# Patient Record
Sex: Female | Born: 1955 | Race: White | Hispanic: No | Marital: Single | Smoking: Never smoker
Health system: Southern US, Community
[De-identification: ages and names within clinical notes are randomized; demographics above are authoritative.]

## PROBLEM LIST (undated history)

## (undated) DIAGNOSIS — G4733 Obstructive sleep apnea (adult) (pediatric): Secondary | ICD-10-CM

## (undated) DIAGNOSIS — G5762 Lesion of plantar nerve, left lower limb: Secondary | ICD-10-CM

## (undated) DIAGNOSIS — N950 Postmenopausal bleeding: Secondary | ICD-10-CM

---

## 2006-05-13 ENCOUNTER — Other Ambulatory Visit: Admission: RE | Admit: 2006-05-13 | Discharge: 2006-05-13 | Payer: Self-pay | Admitting: Family Medicine

## 2006-06-03 ENCOUNTER — Encounter: Admission: RE | Admit: 2006-06-03 | Discharge: 2006-06-03 | Payer: Self-pay | Admitting: Family Medicine

## 2007-05-14 ENCOUNTER — Other Ambulatory Visit: Admission: RE | Admit: 2007-05-14 | Discharge: 2007-05-14 | Payer: Self-pay | Admitting: Family Medicine

## 2007-07-21 ENCOUNTER — Encounter: Admission: RE | Admit: 2007-07-21 | Discharge: 2007-07-21 | Payer: Self-pay | Admitting: Family Medicine

## 2008-09-06 ENCOUNTER — Other Ambulatory Visit: Admission: RE | Admit: 2008-09-06 | Discharge: 2008-09-06 | Payer: Self-pay | Admitting: Family Medicine

## 2009-04-20 ENCOUNTER — Encounter: Admission: RE | Admit: 2009-04-20 | Discharge: 2009-04-20 | Payer: Self-pay | Admitting: Family Medicine

## 2010-07-06 ENCOUNTER — Encounter: Admission: RE | Admit: 2010-07-06 | Discharge: 2010-07-06 | Payer: Self-pay | Admitting: Family Medicine

## 2010-07-10 ENCOUNTER — Other Ambulatory Visit: Admission: RE | Admit: 2010-07-10 | Discharge: 2010-07-10 | Payer: Self-pay | Admitting: Family Medicine

## 2011-09-02 ENCOUNTER — Other Ambulatory Visit: Payer: Self-pay | Admitting: Family Medicine

## 2011-09-02 DIAGNOSIS — Z1231 Encounter for screening mammogram for malignant neoplasm of breast: Secondary | ICD-10-CM

## 2011-09-19 ENCOUNTER — Ambulatory Visit
Admission: RE | Admit: 2011-09-19 | Discharge: 2011-09-19 | Disposition: A | Discharge status: Home / Self Care | Payer: BC Managed Care – PPO | Source: Ambulatory Visit | Attending: Family Medicine | Admitting: Family Medicine

## 2011-09-19 DIAGNOSIS — Z1231 Encounter for screening mammogram for malignant neoplasm of breast: Secondary | ICD-10-CM

## 2012-10-14 ENCOUNTER — Other Ambulatory Visit: Payer: Self-pay | Admitting: Family Medicine

## 2012-10-14 DIAGNOSIS — Z1231 Encounter for screening mammogram for malignant neoplasm of breast: Secondary | ICD-10-CM

## 2012-11-20 ENCOUNTER — Ambulatory Visit: Payer: BC Managed Care – PPO

## 2012-12-02 ENCOUNTER — Ambulatory Visit: Payer: BC Managed Care – PPO

## 2013-01-12 ENCOUNTER — Ambulatory Visit
Admission: RE | Admit: 2013-01-12 | Discharge: 2013-01-12 | Disposition: A | Discharge status: Home / Self Care | Payer: BC Managed Care – PPO | Source: Ambulatory Visit | Attending: Family Medicine | Admitting: Family Medicine

## 2013-01-12 DIAGNOSIS — Z1231 Encounter for screening mammogram for malignant neoplasm of breast: Secondary | ICD-10-CM

## 2013-12-14 ENCOUNTER — Other Ambulatory Visit: Payer: Self-pay | Admitting: Family Medicine

## 2013-12-14 ENCOUNTER — Other Ambulatory Visit (HOSPITAL_COMMUNITY)
Admission: RE | Admit: 2013-12-14 | Discharge: 2013-12-14 | Disposition: A | Discharge status: Home / Self Care | Payer: 59 | Source: Ambulatory Visit | Attending: Family Medicine | Admitting: Family Medicine

## 2013-12-14 DIAGNOSIS — Z1151 Encounter for screening for human papillomavirus (HPV): Secondary | ICD-10-CM | POA: Insufficient documentation

## 2013-12-14 DIAGNOSIS — Z124 Encounter for screening for malignant neoplasm of cervix: Secondary | ICD-10-CM | POA: Insufficient documentation

## 2014-02-03 ENCOUNTER — Other Ambulatory Visit: Payer: Self-pay

## 2014-02-03 DIAGNOSIS — Z1231 Encounter for screening mammogram for malignant neoplasm of breast: Secondary | ICD-10-CM

## 2014-02-22 ENCOUNTER — Ambulatory Visit: Payer: 59

## 2014-02-23 ENCOUNTER — Ambulatory Visit
Admission: RE | Admit: 2014-02-23 | Discharge: 2014-02-23 | Disposition: A | Discharge status: Home / Self Care | Payer: 59 | Source: Ambulatory Visit

## 2014-02-23 DIAGNOSIS — Z1231 Encounter for screening mammogram for malignant neoplasm of breast: Secondary | ICD-10-CM

## 2014-06-03 ENCOUNTER — Other Ambulatory Visit: Payer: Self-pay | Admitting: Physician Assistant

## 2014-07-12 ENCOUNTER — Other Ambulatory Visit (HOSPITAL_COMMUNITY)
Admission: RE | Admit: 2014-07-12 | Discharge: 2014-07-12 | Disposition: A | Discharge status: Home / Self Care | Payer: 59 | Source: Ambulatory Visit | Attending: Family Medicine | Admitting: Family Medicine

## 2014-07-12 ENCOUNTER — Other Ambulatory Visit: Payer: Self-pay | Admitting: Family Medicine

## 2014-07-12 DIAGNOSIS — R87610 Atypical squamous cells of undetermined significance on cytologic smear of cervix (ASC-US): Secondary | ICD-10-CM | POA: Diagnosis not present

## 2014-07-13 LAB — CYTOLOGY - PAP

## 2015-01-25 ENCOUNTER — Other Ambulatory Visit: Payer: Self-pay

## 2015-01-25 DIAGNOSIS — Z1231 Encounter for screening mammogram for malignant neoplasm of breast: Secondary | ICD-10-CM

## 2015-02-27 ENCOUNTER — Ambulatory Visit
Admission: RE | Admit: 2015-02-27 | Discharge: 2015-02-27 | Disposition: A | Discharge status: Home / Self Care | Payer: 59 | Source: Ambulatory Visit

## 2015-02-27 DIAGNOSIS — Z1231 Encounter for screening mammogram for malignant neoplasm of breast: Secondary | ICD-10-CM

## 2015-05-09 ENCOUNTER — Ambulatory Visit (HOSPITAL_BASED_OUTPATIENT_CLINIC_OR_DEPARTMENT_OTHER): Payer: 59 | Attending: Otolaryngology | Admitting: Radiology

## 2015-05-09 VITALS — Ht 64.0 in | Wt 158.0 lb

## 2015-05-09 DIAGNOSIS — G4733 Obstructive sleep apnea (adult) (pediatric): Secondary | ICD-10-CM | POA: Diagnosis not present

## 2015-05-09 DIAGNOSIS — R0683 Snoring: Secondary | ICD-10-CM | POA: Insufficient documentation

## 2015-05-09 DIAGNOSIS — G4700 Insomnia, unspecified: Secondary | ICD-10-CM | POA: Diagnosis present

## 2015-05-20 DIAGNOSIS — R0683 Snoring: Secondary | ICD-10-CM | POA: Diagnosis not present

## 2015-05-20 NOTE — Sleep Study (Signed)
   NAME: Teresa GripKarol J Vogler DATE OF BIRTH:  06/06/1956 MEDICAL RECORD NUMBER [id number]  LOCATION: [facility]  PHYSICIAN: YOUNG,CLINTON D  DATE OF STUDY: 05/09/2015  SLEEP STUDY TYPE: Nocturnal Polysomnogram               REFERRING PHYSICIAN: Christia ReadingBates, Dwight, MD  INDICATION FOR STUDY: Insomnia with sleep apnea  EPWORTH SLEEPINESS SCORE:   7/24 HEIGHT: 5\' 4"  (162.6 cm)  WEIGHT: 158 lb (71.668 kg)    Body mass index is 27.11 kg/(m^2).  NECK SIZE: 14 in.  MEDICATIONS: Charted for review  SLEEP ARCHITECTURE: Total sleep time 296 minutes with sleep efficiency 75.3%. Stage I was 5.4%, stage II 60.6%, stage III 1.0%, REM 32.9% of total sleep time. Sleep latency 19.5 minutes, REM latency 46 minutes, awake after sleep onset 66.5 minutes, arousal index 13, bedtime medication: Ambien, progesterone. She was awake spontaneously between 1:15 and 2:30 AM.  RESPIRATORY DATA: Apnea hypopnea index (AHI) 12.2 per hour. 60 total events scored including 32 obstructive apneas, 2 mixed apneas, 26 hypopneas. Most events were while supine and in rem. REM AHI 25.8 per hour. This study was ordered as a diagnostic polysomnogram without CPAP.  OXYGEN DATA: Moderately loud snoring with oxygen desaturation to a nadir of 82% and mean saturation 92.6% on room air  CARDIAC DATA: Normal sinus rhythm  MOVEMENT/PARASOMNIA: 68 total limb jerks were counted, none of which were associated with arousal or awakening. No bathroom trips  IMPRESSION/ RECOMMENDATION:   1) Despite taking Ambien at bedtime, the patient was spontaneously awake between 1:15 and 2:30 AM. 2) Mild obstructive sleep apnea/hypopnea syndrome, AHI 12.2 per hour with mostly supine events. REM AHI 25.8 per hour. Moderately loud snoring with oxygen desaturation to a nadir of 82% and mean saturation 92.6% on room air. Study was ordered as a diagnostic polysomnogram protocol without CPAP.     Waymon BudgeYOUNG,CLINTON D Diplomate, American Board of Sleep  Medicine  ELECTRONICALLY SIGNED ON:  05/20/2015, 9:47 AM [facility] PH: [phone]   FX: [phone] ACCREDITED BY THE AMERICAN ACADEMY OF SLEEP MEDICINE

## 2015-06-07 ENCOUNTER — Encounter (HOSPITAL_BASED_OUTPATIENT_CLINIC_OR_DEPARTMENT_OTHER): Payer: 59

## 2015-08-22 ENCOUNTER — Ambulatory Visit (INDEPENDENT_AMBULATORY_CARE_PROVIDER_SITE_OTHER): Payer: 59 | Admitting: Sports Medicine

## 2015-08-22 ENCOUNTER — Encounter: Payer: Self-pay | Admitting: Sports Medicine

## 2015-08-22 VITALS — BP 128/86 | Ht 64.0 in | Wt 160.0 lb

## 2015-08-22 DIAGNOSIS — M79672 Pain in left foot: Secondary | ICD-10-CM

## 2015-08-22 NOTE — Assessment & Plan Note (Signed)
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  This is related to the loss of her transverse arch  - two small metatarsal pads place on Birkenstock's  - encourage to continue exercising without pain  - would advise against using minimalist shoes  - she can use ibuprofen or aleve for pain PRN  - she will f/u with other shoes for metatarsal pads to be placed  - could try green insoles at f/u and will need custom orthotics at some point.

## 2015-08-22 NOTE — Patient Instructions (Signed)
Please follow up and bring your shoes with you.   We will place some metatarsal pads in them and green insoles.

## 2015-08-22 NOTE — Progress Notes (Signed)
  Teresa Avery - 59 y.o. female MRN [id number]  Date of birth: [birth year]   Teresa Avery is a 59 y.o. female who presents today for left foot pain. She saw an orthopedist about three years ago and diagnosed with Morton's Neuroma.   About three months ago she started wearing a minimalist shoe while working out. She does functional fitness and walks her dog. She initially would work out about 4 days per week and is now doing 3 days per week.  She started having more pain in the forefoot and in her left great toe.  It started hurting bad enough that she couldn't do sprints.  She started wearing Birkenstocks and that has improved her pain.  She will fell a "clicking" sensation in her foot.  She denies any prior injury or surgery to her left foot.     PMHx - Updated and reviewed.   PSHx - Updated and reviewed.  FHx - Updated and reviewed. Medications - hormones    ROS Per HPI   Exam:  Filed Vitals:   08/22/15 1404  BP: 128/86   Gen: NAD Cardiorespiratory - Normal respiratory effort/rate.   Foot Exam:  Laterality: left Appearance: no erythema or ecchymosis,  left calcaneal valgus at baseline   calcaneous comes to midline while upon tip toes.  Splaying of 2nd and 3rd toes on left  Loss of transverse arch on left  Callus formation on plantar aspect of great toe b/l. No other overt callus formation  Gait: pronation with ambulation Edema: none   Tenderness: TTP between 3rd and 4th metatarsal   Neurovascularly intact: yes Arch: partial loss of longitudinal on left and neutral on right  Maneuvers: Squeeze test: positive on left  Strength:  Dorsiflexion: 5/5 Plantarflexion: 5/5 Inversion: 5/5 Eversion: 5/5   Left foot pain Pain most likely 2/2 morton's neuroma.  This is related to the loss of her transverse arch  - two small metatarsal pads place on Birkenstock's  - encourage to continue exercising without pain  - would advise against using minimalist shoes  - she can use  ibuprofen or aleve for pain PRN  - she will f/u with other shoes for metatarsal pads to be placed  - could try green insoles at f/u and will need custom orthotics at some point.

## 2015-09-07 ENCOUNTER — Encounter: Payer: Self-pay | Admitting: Sports Medicine

## 2015-09-07 ENCOUNTER — Ambulatory Visit (INDEPENDENT_AMBULATORY_CARE_PROVIDER_SITE_OTHER): Payer: 59 | Admitting: Sports Medicine

## 2015-09-07 VITALS — BP 146/87 | HR 67 | Ht 64.0 in | Wt 160.0 lb

## 2015-09-07 DIAGNOSIS — G5762 Lesion of plantar nerve, left lower limb: Secondary | ICD-10-CM | POA: Insufficient documentation

## 2015-09-07 DIAGNOSIS — M79672 Pain in left foot: Secondary | ICD-10-CM | POA: Diagnosis not present

## 2015-09-07 NOTE — Assessment & Plan Note (Signed)
Pain much improved with MT pads  Vies visit note

## 2015-09-07 NOTE — Assessment & Plan Note (Signed)
Cont MT support Wide shoes  Sports insoles but later may convert to custom orthotic if needed

## 2015-09-07 NOTE — Progress Notes (Signed)
   [facility] Yolande Jolly, MD Phone: [phone]  Subjective:   # Follow Up for Left Foot pain - pt. With morton's neuroma on the left.  - Foot pain in both feet due to loss of arch.  - She was given metatarsal pads due to loss of transverse arch and morton's neuroma at her last visit.  - She has had a good result with this, and relief of her pain.  - She is here to get her shoes fitted with more metatarsal pads.   All relevant systems were reviewed and were negative unless otherwise noted in the HPI  Past Medical History Reviewed problem list.  Medications- reviewed and updated Current Outpatient Prescriptions  Medication Sig Dispense Refill  . estradiol (CLIMARA - DOSED IN MG/24 HR) 0.025 mg/24hr patch Place 0.025 mg onto the skin once a week.     No current facility-administered medications for this visit.   Chief complaint-noted No additions to family history Social history- patient is a non smoker  Objective: BP 146/87 mmHg  Pulse 67  Ht  (1.626 m)  Wt 160 lb (72.576 kg)  BMI 27.45 kg/m2 INSPECTION: Feet with obvious loss of the transverse and longitudinal arch.  PALPATION: TTP and positive squeeze test on the left with pain between the 2-3 metatarsal.  RANGE OF MOTION: FROM of left and right ankles.  STRENGTH: 5/5 strength in all muscle groups distally.  SPECIAL TESTS: Squeeze test positive.  NEUROVASCULAR STATUS: neurovascularly in tact.   [place] used to confirm presence of neuroma Lt 3/4 which does pop up with Mulder's test   Assessment/Plan:  # Morton's Neuroma / Loss of Transverse Arch.  - Pt. To continue to use the metatarsal pads.  - 5 pairs of shoes padded today.  - Temporary orthotic for her athletic shoes with metatarsal pad was given.  - Instructions to utilize shoes that are comfortable, and with the padding were given.  - No flip flops, or minimalist shoes.  - No exercises that cause pain.  - Return for follow up and  custom orthotic as desired.

## 2015-10-10 ENCOUNTER — Ambulatory Visit (INDEPENDENT_AMBULATORY_CARE_PROVIDER_SITE_OTHER): Payer: 59 | Admitting: Family Medicine

## 2015-10-10 ENCOUNTER — Encounter: Payer: Self-pay | Admitting: Family Medicine

## 2015-10-10 VITALS — BP 128/89 | HR 60 | Ht 64.0 in | Wt 160.0 lb

## 2015-10-10 DIAGNOSIS — G5762 Lesion of plantar nerve, left lower limb: Secondary | ICD-10-CM | POA: Diagnosis not present

## 2015-10-10 DIAGNOSIS — M79672 Pain in left foot: Secondary | ICD-10-CM

## 2015-10-11 NOTE — Assessment & Plan Note (Signed)
Continue wearing wide shoes and metatarsal pads in shoes.  Custom orthotic constructed today. F/u for further orthotic construction.

## 2015-10-11 NOTE — Progress Notes (Signed)
  Teresa GripKarol J Avery - 59 y.o. female MRN [id number]  Date of birth: 11/10/1956  CC: Left Foot Pain  SUBJECTIVE:   HPI Follow of left foot pain for orthotics:  - morton's neuroma on the left - B/l foot pain with loss of transverse and longitudinal arches - Improvement following use of metatarsal pads on temporary insoles.  - Interested in getting long term orthotics.   ROS:     10 point RoS negative other than that listed in HPI.   HISTORY: Past Medical, Surgical, Social, and Family History Reviewed & Updated per EMR.    OBJECTIVE: BP 128/89 mmHg  Pulse 60  Ht 5\' 4"  (1.626 m)  Wt 160 lb (72.576 kg)  BMI 27.45 kg/m2  Physical Exam  Calm, NAD Speaking in full sentences without respiratory distress.   INSPECTION: B/l loss of the transverse and longitudinal arch.  PALPATION: TTP and positive squeeze test on the left with pain between the 2-3 metatarsal on the lef.  STRENGTH: 5/5 strength in all muscle groups distally.  SPECIAL TESTS: Squeeze test positive. + Mulder NEUROVASCULAR STATUS: neurovascularly in tact.   MEDICATIONS, LABS & OTHER ORDERS: Previous Medications   ESTRADIOL (CLIMARA - DOSED IN MG/24 HR) 0.025 MG/24HR PATCH    Place 0.025 mg onto the skin once a week.   Modified Medications   No medications on file   New Prescriptions   No medications on file   Discontinued Medications   No medications on file  No orders of the defined types were placed in this encounter.   ASSESSMENT & PLAN: See problem based charting & AVS for pt instructions.  Patient was fitted for a : standard, cushioned, semi-rigid orthotic. The orthotic was heated and afterward the patient stood on the orthotic blank positioned on the orthotic stand. The patient was positioned in subtalar neutral position and 10 degrees of ankle dorsiflexion in a weight bearing stance. After completion of molding, a stable base was applied to the orthotic blank. The blank was ground to a stable position for  weight bearing. Size:7  Base: BLUE EVA Posting: right medial heel wedge on the left.  Additional orthotic padding:metatarsal pads.   40 minutes were spent in consultation and construction/adjustment of orthotics. More than 50% of which was face to face time with the patient.

## 2016-03-07 ENCOUNTER — Other Ambulatory Visit: Payer: Self-pay

## 2016-03-07 ENCOUNTER — Other Ambulatory Visit: Payer: Self-pay | Admitting: Neurology

## 2016-03-07 DIAGNOSIS — Z1231 Encounter for screening mammogram for malignant neoplasm of breast: Secondary | ICD-10-CM

## 2016-03-12 ENCOUNTER — Ambulatory Visit
Admission: RE | Admit: 2016-03-12 | Discharge: 2016-03-12 | Disposition: A | Discharge status: Home / Self Care | Payer: 59 | Source: Ambulatory Visit

## 2016-03-12 DIAGNOSIS — Z1231 Encounter for screening mammogram for malignant neoplasm of breast: Secondary | ICD-10-CM

## 2016-07-03 ENCOUNTER — Other Ambulatory Visit: Payer: Self-pay | Admitting: Family Medicine

## 2016-07-03 DIAGNOSIS — N950 Postmenopausal bleeding: Secondary | ICD-10-CM

## 2016-07-15 ENCOUNTER — Ambulatory Visit
Admission: RE | Admit: 2016-07-15 | Discharge: 2016-07-15 | Disposition: A | Discharge status: Home / Self Care | Payer: 59 | Source: Ambulatory Visit | Attending: Family Medicine | Admitting: Family Medicine

## 2016-07-15 DIAGNOSIS — N950 Postmenopausal bleeding: Secondary | ICD-10-CM

## 2016-08-05 ENCOUNTER — Other Ambulatory Visit: Payer: Self-pay | Admitting: Neurosurgery

## 2016-08-05 DIAGNOSIS — M5023 Other cervical disc displacement, cervicothoracic region: Secondary | ICD-10-CM

## 2016-09-27 ENCOUNTER — Encounter (HOSPITAL_BASED_OUTPATIENT_CLINIC_OR_DEPARTMENT_OTHER): Payer: Self-pay | Admitting: *Deleted

## 2016-09-30 ENCOUNTER — Encounter (HOSPITAL_BASED_OUTPATIENT_CLINIC_OR_DEPARTMENT_OTHER): Admission: RE | Payer: Self-pay | Source: Ambulatory Visit

## 2016-09-30 ENCOUNTER — Ambulatory Visit (HOSPITAL_BASED_OUTPATIENT_CLINIC_OR_DEPARTMENT_OTHER): Admission: RE | Admit: 2016-09-30 | Payer: 59 | Source: Ambulatory Visit | Admitting: Obstetrics and Gynecology

## 2016-09-30 HISTORY — DX: Lesion of plantar nerve, left lower limb: G57.62

## 2016-09-30 HISTORY — DX: Postmenopausal bleeding: N95.0

## 2016-09-30 HISTORY — DX: Obstructive sleep apnea (adult) (pediatric): G47.33

## 2016-09-30 SURGERY — DILATATION & CURETTAGE/HYSTEROSCOPY WITH MYOSURE
Anesthesia: Choice

## 2016-12-24 DIAGNOSIS — K5730 Diverticulosis of large intestine without perforation or abscess without bleeding: Secondary | ICD-10-CM | POA: Diagnosis not present

## 2016-12-24 DIAGNOSIS — F419 Anxiety disorder, unspecified: Secondary | ICD-10-CM | POA: Diagnosis not present

## 2016-12-24 DIAGNOSIS — Z23 Encounter for immunization: Secondary | ICD-10-CM | POA: Diagnosis not present

## 2016-12-24 DIAGNOSIS — F39 Unspecified mood [affective] disorder: Secondary | ICD-10-CM | POA: Diagnosis not present

## 2017-01-20 ENCOUNTER — Encounter: Payer: Self-pay | Admitting: Sports Medicine

## 2017-01-20 ENCOUNTER — Ambulatory Visit (INDEPENDENT_AMBULATORY_CARE_PROVIDER_SITE_OTHER): Payer: BLUE CROSS/BLUE SHIELD | Admitting: Sports Medicine

## 2017-01-20 VITALS — BP 137/88 | HR 73 | Ht 64.0 in | Wt 168.0 lb

## 2017-01-20 DIAGNOSIS — M7741 Metatarsalgia, right foot: Secondary | ICD-10-CM

## 2017-01-20 DIAGNOSIS — H524 Presbyopia: Secondary | ICD-10-CM | POA: Diagnosis not present

## 2017-01-20 DIAGNOSIS — M7742 Metatarsalgia, left foot: Secondary | ICD-10-CM | POA: Diagnosis not present

## 2017-01-20 DIAGNOSIS — H04123 Dry eye syndrome of bilateral lacrimal glands: Secondary | ICD-10-CM | POA: Diagnosis not present

## 2017-01-20 DIAGNOSIS — H5213 Myopia, bilateral: Secondary | ICD-10-CM | POA: Diagnosis not present

## 2017-01-20 DIAGNOSIS — H52223 Regular astigmatism, bilateral: Secondary | ICD-10-CM | POA: Diagnosis not present

## 2017-01-20 DIAGNOSIS — H2513 Age-related nuclear cataract, bilateral: Secondary | ICD-10-CM | POA: Diagnosis not present

## 2017-01-20 NOTE — Progress Notes (Signed)
   Subjective:    Patient ID: Teresa Avery, female    DOB: 04/26/1956, 61 y.o.   MRN: [id number]  HPI chief complaint: Bilateral foot pain  Very pleasant 61 year old female comes in today for new orthotics. She has a history of metatarsalgia and Morton's neuroma. She had custom orthotics constructed in 2016 which have been helpful. She has continued to change out the metatarsal pads but recently she began to notice pain more diffusely across the entire plantar aspect of her forefoot. Her current orthotics are comfortable but somewhat worn.  Past medical history reviewed Medications reviewed Allergies reviewed    Review of Systems    as above Objective:   Physical Exam  Well-developed, well-nourished. No acute distress. Awake alert and oriented 3. Vital signs reviewed  Examination of both feet show loss of both the transverse and longitudinal arches bilaterally. She is tender to palpation along the metatarsal heads on both feet. 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 Pain with squeeze test. Neurovascularly intact distally. Walking without a significant limp.      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 We will try a wider metatarsal cookie instead of the metatarsal pad. She is instructed to continue to wear shoes that are comfortable for her. She needs to avoid those exercises that cause pain. Follow-up as needed.  Total time spent with the patient was 30 minutes with greater than 50% of the time spent in face-to-face consultation discussing her diagnosis, orthotic construction, orthotic instruction, and fitting. Patient found the orthotics comfortable prior to leaving the office.  Patient was fitted for a : standard, cushioned, semi-rigid orthotic. The orthotic was heated and afterward the patient stood on the orthotic blank positioned on the orthotic stand. The patient was  positioned in subtalar neutral position and 10 degrees of ankle dorsiflexion in a weight bearing stance. After completion of molding, a stable base was applied to the orthotic blank. The blank was ground to a stable position for weight bearing. Size: 7 Base: Blue EVA Posting:none Additional orthotic padding: medial heel wedge

## 2017-03-18 ENCOUNTER — Other Ambulatory Visit: Payer: Self-pay | Admitting: Family Medicine

## 2017-03-18 DIAGNOSIS — Z1231 Encounter for screening mammogram for malignant neoplasm of breast: Secondary | ICD-10-CM

## 2017-03-19 DIAGNOSIS — Z0000 Encounter for general adult medical examination without abnormal findings: Secondary | ICD-10-CM | POA: Diagnosis not present

## 2017-03-19 DIAGNOSIS — E7800 Pure hypercholesterolemia, unspecified: Secondary | ICD-10-CM | POA: Diagnosis not present

## 2017-04-03 ENCOUNTER — Ambulatory Visit
Admission: RE | Admit: 2017-04-03 | Discharge: 2017-04-03 | Disposition: A | Discharge status: Home / Self Care | Payer: BLUE CROSS/BLUE SHIELD | Source: Ambulatory Visit | Attending: Family Medicine | Admitting: Family Medicine

## 2017-04-03 DIAGNOSIS — Z1231 Encounter for screening mammogram for malignant neoplasm of breast: Secondary | ICD-10-CM | POA: Diagnosis not present

## 2017-04-04 DIAGNOSIS — F4323 Adjustment disorder with mixed anxiety and depressed mood: Secondary | ICD-10-CM | POA: Diagnosis not present

## 2017-05-14 DIAGNOSIS — F4323 Adjustment disorder with mixed anxiety and depressed mood: Secondary | ICD-10-CM | POA: Diagnosis not present

## 2017-08-13 DIAGNOSIS — H00013 Hordeolum externum right eye, unspecified eyelid: Secondary | ICD-10-CM | POA: Diagnosis not present

## 2017-09-08 DIAGNOSIS — F4323 Adjustment disorder with mixed anxiety and depressed mood: Secondary | ICD-10-CM | POA: Diagnosis not present

## 2017-09-12 DIAGNOSIS — F4323 Adjustment disorder with mixed anxiety and depressed mood: Secondary | ICD-10-CM | POA: Diagnosis not present

## 2017-09-30 DIAGNOSIS — F4323 Adjustment disorder with mixed anxiety and depressed mood: Secondary | ICD-10-CM | POA: Diagnosis not present

## 2017-10-16 ENCOUNTER — Ambulatory Visit (INDEPENDENT_AMBULATORY_CARE_PROVIDER_SITE_OTHER): Payer: BLUE CROSS/BLUE SHIELD | Admitting: Sports Medicine

## 2017-10-16 VITALS — BP 108/80 | Ht 64.0 in | Wt 152.0 lb

## 2017-10-16 DIAGNOSIS — M7741 Metatarsalgia, right foot: Secondary | ICD-10-CM

## 2017-10-16 DIAGNOSIS — M7742 Metatarsalgia, left foot: Secondary | ICD-10-CM | POA: Diagnosis not present

## 2017-10-17 NOTE — Progress Notes (Signed)
   Subjective:    Patient ID: Teresa Avery, female    DOB: 02/03/1956, 61 y.o.   MRN: [id number]  HPI chief complaint: Bilateral foot pain  Patient comes in today with returning bilateral foot pain. This is a chronic problem for her. We've made her custom orthotics in the past. They've been very comfortable for her. She recently purchased a new pair of shoes and thinks that her current orthotics are too small for them.She has also started increasing her exercise, specifically walking 4 miles a day in addition to doing Zumba twice a week. She has been able to lose 20 pounds with this exercise regimen. Her pain is along the metatarsal heads of both feet. Some arch pain. No injury. No numbness or tingling.  Interim medical history reviewed Medications reviewed Allergies reviewed   Review of Systems As above    Objective:   Physical Exam  Well-developed, well-nourished. No acute distress. Awake alert and oriented 3. Vital signs reviewed  Examination of both feet continues to show loss of the transverse arches bilaterally. Loss of the longitudinal arches well. Slightly tender to palpation along the metatarsal heads on both feet. 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 No soft tissue swelling. Good pulses. Walking without a significant limp.      Assessment & Plan:   Chronic metatarsalgia Pes planus History of Morton's neuroma  Patient's current orthotics are too small for her current tennis shoes. We will make her new custom orthotics today.Her current orthotics also have a metatarsal pad which the patient on herself. However, she has done better with metatarsal cookies in the past. I evaluated her Zumba shoes which have absolutely no arch support and I've recommended that she not use these while exercising. Patient found her new orthotics to be comfortable prior to leaving the office. Total of 30 minutes was spent with the patient with greater than 50% of the time spent in face-to-face  consultation discussing orthotic construction, instruction, and fitting. Patient will follow-up with me as needed.  Patient was fitted for a : standard, cushioned, semi-rigid orthotic. The orthotic was heated and afterward the patient stood on the orthotic blank positioned on the orthotic stand. The patient was positioned in subtalar neutral position and 10 degrees of ankle dorsiflexion in a weight bearing stance. After completion of molding, a stable base was applied to the orthotic blank. The blank was ground to a stable position for weight bearing. Size: 8 Base: Blue EVA Posting: none Additional orthotic padding: B/L metatarsal cookies; medial heel wedge

## 2017-10-20 DIAGNOSIS — F4323 Adjustment disorder with mixed anxiety and depressed mood: Secondary | ICD-10-CM | POA: Diagnosis not present

## 2017-11-07 DIAGNOSIS — G4733 Obstructive sleep apnea (adult) (pediatric): Secondary | ICD-10-CM | POA: Diagnosis not present

## 2017-11-20 DIAGNOSIS — F4323 Adjustment disorder with mixed anxiety and depressed mood: Secondary | ICD-10-CM | POA: Diagnosis not present

## 2017-11-24 DIAGNOSIS — L814 Other melanin hyperpigmentation: Secondary | ICD-10-CM | POA: Diagnosis not present

## 2017-11-24 DIAGNOSIS — L820 Inflamed seborrheic keratosis: Secondary | ICD-10-CM | POA: Diagnosis not present

## 2017-11-24 DIAGNOSIS — D225 Melanocytic nevi of trunk: Secondary | ICD-10-CM | POA: Diagnosis not present

## 2017-11-24 DIAGNOSIS — L821 Other seborrheic keratosis: Secondary | ICD-10-CM | POA: Diagnosis not present

## 2017-11-26 DIAGNOSIS — F4323 Adjustment disorder with mixed anxiety and depressed mood: Secondary | ICD-10-CM | POA: Diagnosis not present

## 2017-12-12 DIAGNOSIS — F4323 Adjustment disorder with mixed anxiety and depressed mood: Secondary | ICD-10-CM | POA: Diagnosis not present

## 2017-12-29 DIAGNOSIS — J0100 Acute maxillary sinusitis, unspecified: Secondary | ICD-10-CM | POA: Diagnosis not present

## 2018-01-05 DIAGNOSIS — F4323 Adjustment disorder with mixed anxiety and depressed mood: Secondary | ICD-10-CM | POA: Diagnosis not present

## 2018-01-20 DIAGNOSIS — F4323 Adjustment disorder with mixed anxiety and depressed mood: Secondary | ICD-10-CM | POA: Diagnosis not present

## 2018-02-02 DIAGNOSIS — F4323 Adjustment disorder with mixed anxiety and depressed mood: Secondary | ICD-10-CM | POA: Diagnosis not present

## 2018-02-17 DIAGNOSIS — F4323 Adjustment disorder with mixed anxiety and depressed mood: Secondary | ICD-10-CM | POA: Diagnosis not present

## 2018-03-04 DIAGNOSIS — F4323 Adjustment disorder with mixed anxiety and depressed mood: Secondary | ICD-10-CM | POA: Diagnosis not present

## 2018-03-23 DIAGNOSIS — G2581 Restless legs syndrome: Secondary | ICD-10-CM | POA: Diagnosis not present

## 2018-03-31 DIAGNOSIS — F4323 Adjustment disorder with mixed anxiety and depressed mood: Secondary | ICD-10-CM | POA: Diagnosis not present

## 2018-04-03 ENCOUNTER — Other Ambulatory Visit: Payer: Self-pay | Admitting: Family Medicine

## 2018-04-03 DIAGNOSIS — Z139 Encounter for screening, unspecified: Secondary | ICD-10-CM

## 2018-04-17 DIAGNOSIS — F4323 Adjustment disorder with mixed anxiety and depressed mood: Secondary | ICD-10-CM | POA: Diagnosis not present

## 2018-05-07 ENCOUNTER — Ambulatory Visit: Payer: BLUE CROSS/BLUE SHIELD

## 2018-05-08 ENCOUNTER — Ambulatory Visit: Payer: BLUE CROSS/BLUE SHIELD

## 2018-05-12 DIAGNOSIS — F4323 Adjustment disorder with mixed anxiety and depressed mood: Secondary | ICD-10-CM | POA: Diagnosis not present

## 2018-06-01 ENCOUNTER — Other Ambulatory Visit: Payer: Self-pay | Admitting: Family Medicine

## 2018-06-01 ENCOUNTER — Other Ambulatory Visit (HOSPITAL_COMMUNITY)
Admission: RE | Admit: 2018-06-01 | Discharge: 2018-06-01 | Disposition: A | Discharge status: Home / Self Care | Payer: BLUE CROSS/BLUE SHIELD | Source: Ambulatory Visit | Attending: Family Medicine | Admitting: Family Medicine

## 2018-06-01 DIAGNOSIS — Z131 Encounter for screening for diabetes mellitus: Secondary | ICD-10-CM | POA: Diagnosis not present

## 2018-06-01 DIAGNOSIS — F4323 Adjustment disorder with mixed anxiety and depressed mood: Secondary | ICD-10-CM | POA: Diagnosis not present

## 2018-06-01 DIAGNOSIS — Z23 Encounter for immunization: Secondary | ICD-10-CM | POA: Diagnosis not present

## 2018-06-01 DIAGNOSIS — Z0000 Encounter for general adult medical examination without abnormal findings: Secondary | ICD-10-CM | POA: Diagnosis not present

## 2018-06-01 DIAGNOSIS — Z1159 Encounter for screening for other viral diseases: Secondary | ICD-10-CM | POA: Diagnosis not present

## 2018-06-01 DIAGNOSIS — Z124 Encounter for screening for malignant neoplasm of cervix: Secondary | ICD-10-CM | POA: Insufficient documentation

## 2018-06-01 DIAGNOSIS — E7800 Pure hypercholesterolemia, unspecified: Secondary | ICD-10-CM | POA: Diagnosis not present

## 2018-06-04 LAB — CYTOLOGY - PAP: Diagnosis: NEGATIVE

## 2018-06-24 DIAGNOSIS — F4323 Adjustment disorder with mixed anxiety and depressed mood: Secondary | ICD-10-CM | POA: Diagnosis not present

## 2018-07-08 DIAGNOSIS — F4323 Adjustment disorder with mixed anxiety and depressed mood: Secondary | ICD-10-CM | POA: Diagnosis not present

## 2018-07-15 DIAGNOSIS — F4323 Adjustment disorder with mixed anxiety and depressed mood: Secondary | ICD-10-CM | POA: Diagnosis not present

## 2018-07-29 DIAGNOSIS — F4323 Adjustment disorder with mixed anxiety and depressed mood: Secondary | ICD-10-CM | POA: Diagnosis not present

## 2018-08-19 DIAGNOSIS — F4323 Adjustment disorder with mixed anxiety and depressed mood: Secondary | ICD-10-CM | POA: Diagnosis not present

## 2018-09-11 DIAGNOSIS — F4323 Adjustment disorder with mixed anxiety and depressed mood: Secondary | ICD-10-CM | POA: Diagnosis not present

## 2018-10-02 DIAGNOSIS — F4323 Adjustment disorder with mixed anxiety and depressed mood: Secondary | ICD-10-CM | POA: Diagnosis not present

## 2018-10-08 DIAGNOSIS — G4733 Obstructive sleep apnea (adult) (pediatric): Secondary | ICD-10-CM | POA: Diagnosis not present

## 2018-10-21 DIAGNOSIS — F4323 Adjustment disorder with mixed anxiety and depressed mood: Secondary | ICD-10-CM | POA: Diagnosis not present

## 2018-11-09 ENCOUNTER — Ambulatory Visit
Admission: RE | Admit: 2018-11-09 | Discharge: 2018-11-09 | Disposition: A | Discharge status: Home / Self Care | Payer: BLUE CROSS/BLUE SHIELD | Source: Ambulatory Visit | Attending: Family Medicine | Admitting: Family Medicine

## 2018-11-09 DIAGNOSIS — F4323 Adjustment disorder with mixed anxiety and depressed mood: Secondary | ICD-10-CM | POA: Diagnosis not present

## 2018-11-09 DIAGNOSIS — Z1231 Encounter for screening mammogram for malignant neoplasm of breast: Secondary | ICD-10-CM | POA: Diagnosis not present

## 2018-11-09 DIAGNOSIS — Z139 Encounter for screening, unspecified: Secondary | ICD-10-CM

## 2018-11-26 DIAGNOSIS — S0501XA Injury of conjunctiva and corneal abrasion without foreign body, right eye, initial encounter: Secondary | ICD-10-CM | POA: Diagnosis not present

## 2018-11-30 DIAGNOSIS — F4323 Adjustment disorder with mixed anxiety and depressed mood: Secondary | ICD-10-CM | POA: Diagnosis not present

## 2018-12-18 DIAGNOSIS — F4323 Adjustment disorder with mixed anxiety and depressed mood: Secondary | ICD-10-CM | POA: Diagnosis not present

## 2019-01-15 DIAGNOSIS — F4323 Adjustment disorder with mixed anxiety and depressed mood: Secondary | ICD-10-CM | POA: Diagnosis not present

## 2019-03-25 DIAGNOSIS — G4733 Obstructive sleep apnea (adult) (pediatric): Secondary | ICD-10-CM | POA: Diagnosis not present

## 2019-07-02 DIAGNOSIS — Z20828 Contact with and (suspected) exposure to other viral communicable diseases: Secondary | ICD-10-CM | POA: Diagnosis not present

## 2019-07-06 DIAGNOSIS — Z03818 Encounter for observation for suspected exposure to other biological agents ruled out: Secondary | ICD-10-CM | POA: Diagnosis not present

## 2019-07-15 DIAGNOSIS — Z0000 Encounter for general adult medical examination without abnormal findings: Secondary | ICD-10-CM | POA: Diagnosis not present

## 2019-07-17 DIAGNOSIS — R110 Nausea: Secondary | ICD-10-CM | POA: Diagnosis not present

## 2019-07-17 DIAGNOSIS — R51 Headache: Secondary | ICD-10-CM | POA: Diagnosis not present

## 2019-07-19 DIAGNOSIS — R110 Nausea: Secondary | ICD-10-CM | POA: Diagnosis not present

## 2019-07-19 DIAGNOSIS — R51 Headache: Secondary | ICD-10-CM | POA: Diagnosis not present

## 2019-07-21 ENCOUNTER — Other Ambulatory Visit: Payer: Self-pay | Admitting: Gastroenterology

## 2019-07-21 DIAGNOSIS — K5792 Diverticulitis of intestine, part unspecified, without perforation or abscess without bleeding: Secondary | ICD-10-CM

## 2019-07-21 DIAGNOSIS — K5900 Constipation, unspecified: Secondary | ICD-10-CM | POA: Diagnosis not present

## 2019-07-21 DIAGNOSIS — R1032 Left lower quadrant pain: Secondary | ICD-10-CM | POA: Diagnosis not present

## 2019-07-21 DIAGNOSIS — E7800 Pure hypercholesterolemia, unspecified: Secondary | ICD-10-CM | POA: Diagnosis not present

## 2019-07-21 DIAGNOSIS — Z79899 Other long term (current) drug therapy: Secondary | ICD-10-CM | POA: Diagnosis not present

## 2019-07-22 ENCOUNTER — Ambulatory Visit
Admission: RE | Admit: 2019-07-22 | Discharge: 2019-07-22 | Disposition: A | Discharge status: Home / Self Care | Payer: BC Managed Care – PPO | Source: Ambulatory Visit | Attending: Gastroenterology | Admitting: Gastroenterology

## 2019-07-22 DIAGNOSIS — K5730 Diverticulosis of large intestine without perforation or abscess without bleeding: Secondary | ICD-10-CM | POA: Diagnosis not present

## 2019-07-22 DIAGNOSIS — K5792 Diverticulitis of intestine, part unspecified, without perforation or abscess without bleeding: Secondary | ICD-10-CM

## 2019-07-22 MED ORDER — IOPAMIDOL (ISOVUE-300) INJECTION 61%
100.0000 mL | Freq: Once | INTRAVENOUS | Status: AC | PRN
  Administered 2019-07-22: 100 mL via INTRAVENOUS

## 2019-08-05 IMAGING — CT CT ABDOMEN AND PELVIS WITH CONTRAST
1 of 3 series · 13 of 32 positions shown, 18 images · IV contrast (APPLIED)
Comparison: No priors.

CLINICAL DATA: 63-year-old female with history of left lower
quadrant abdominal pain. Evaluate for diverticulitis.

EXAM:
CT ABDOMEN AND PELVIS WITH CONTRAST
TECHNIQUE: Multidetector CT imaging of the abdomen and pelvis was performed
using the standard protocol following bolus administration of
intravenous contrast.
CONTRAST:  100mL 4SNU4B-P00 IOPAMIDOL (4SNU4B-P00) INJECTION 61%

[Series 2: abd/pelvis w/cm · axial · 0.71mm/px · z∈[-574,-194]mm · 13 of 88 slices shown, 18 images]
[im 6/88  soft-tissue]
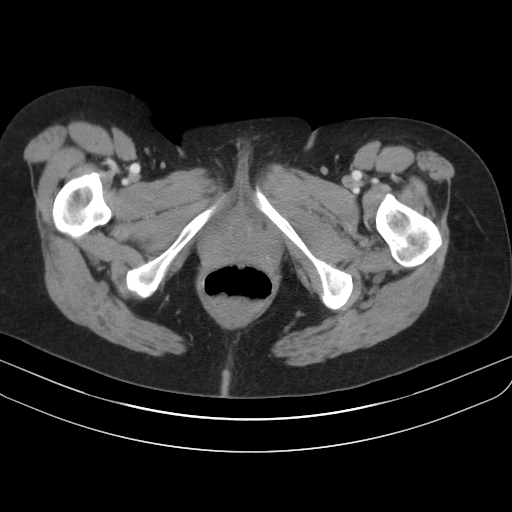
[im 6/88  bone]
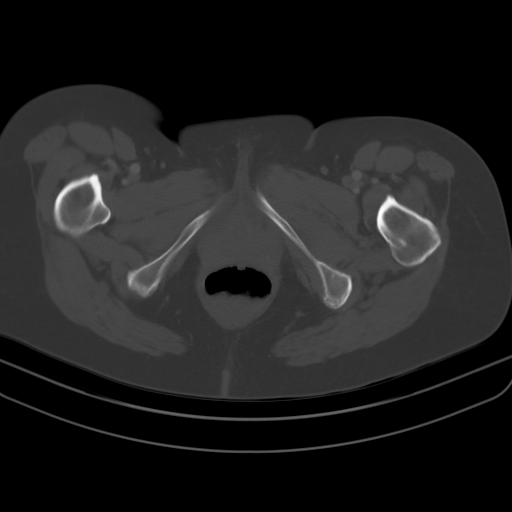
[im 12/88  soft-tissue]
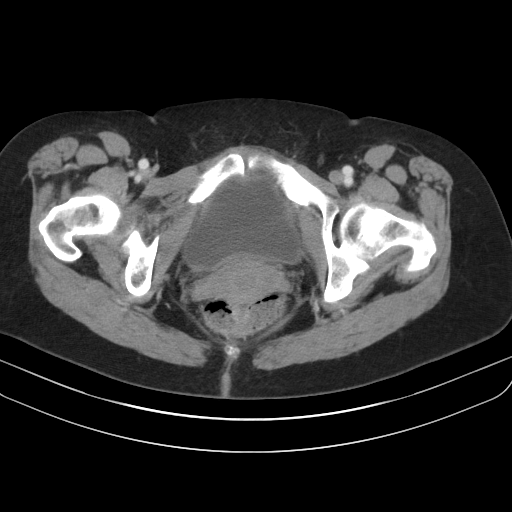
[im 18/88  soft-tissue]
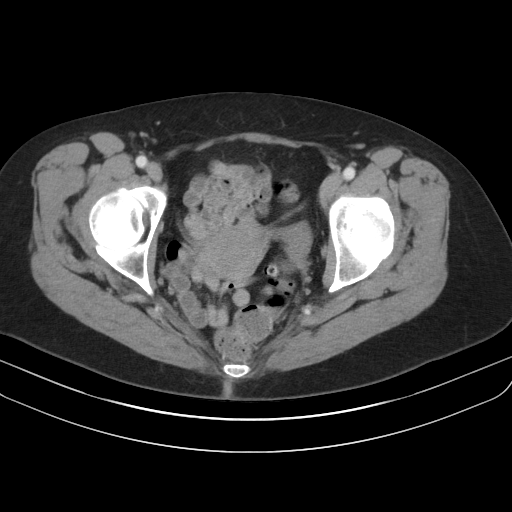
[im 30/88  soft-tissue]
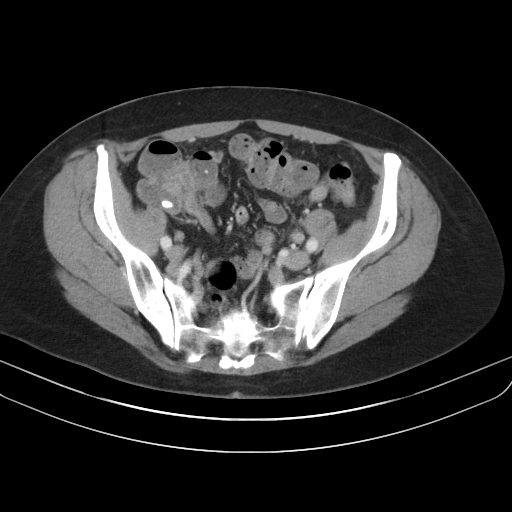
[im 35/88  soft-tissue]
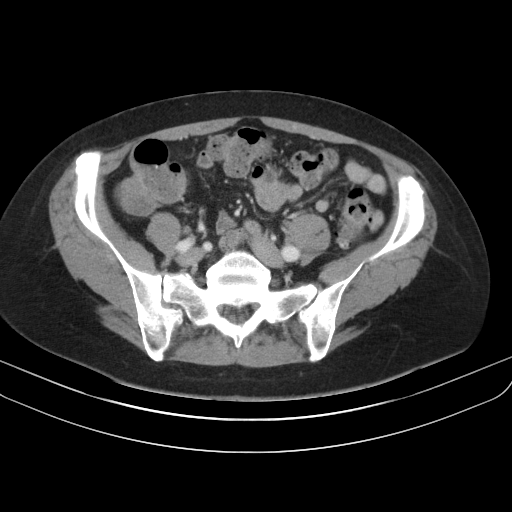
[im 41/88  soft-tissue]
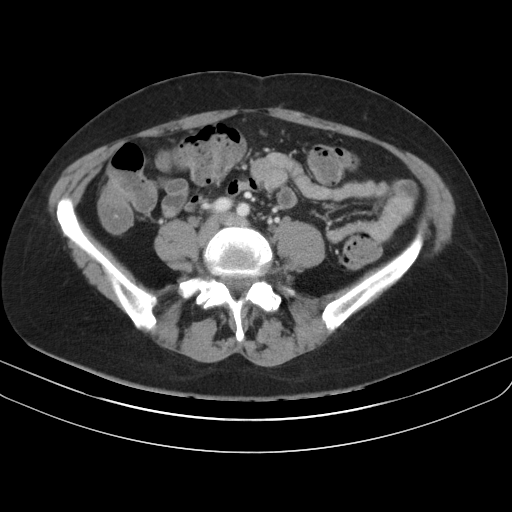
[im 47/88  soft-tissue]
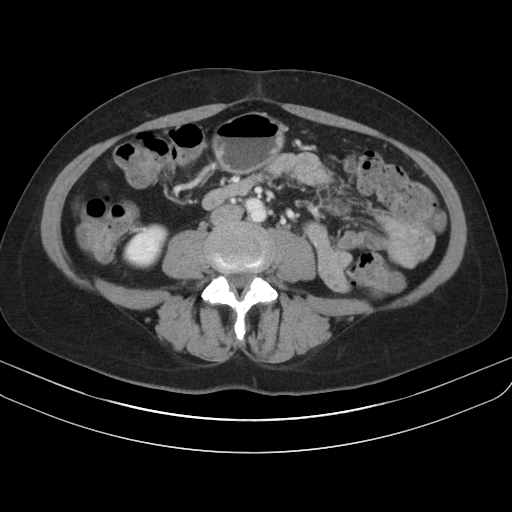
[im 53/88  soft-tissue]
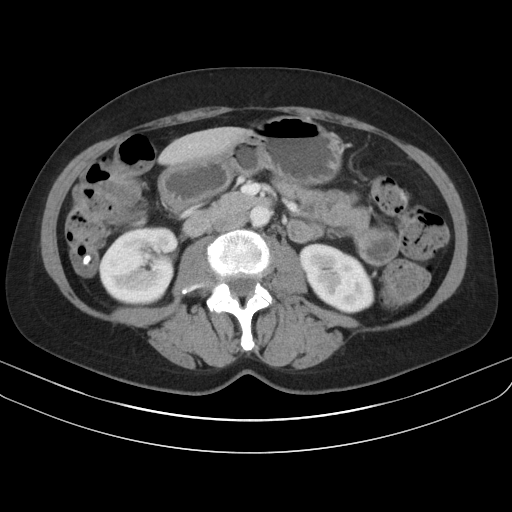
[im 59/88  soft-tissue]
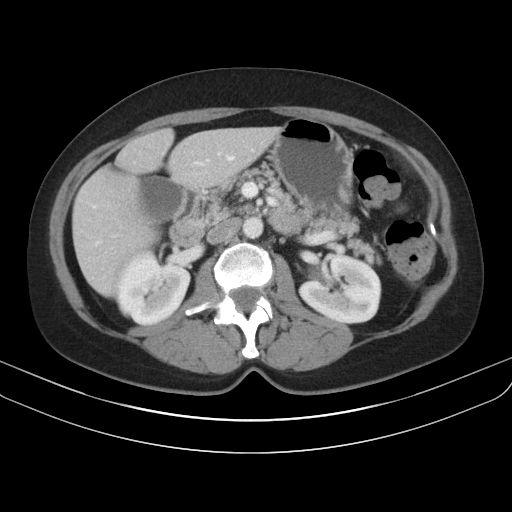
[im 59/88  bone]
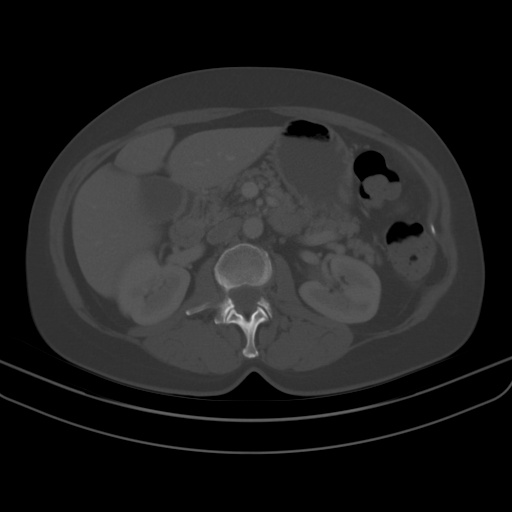
[im 64/88  lung]
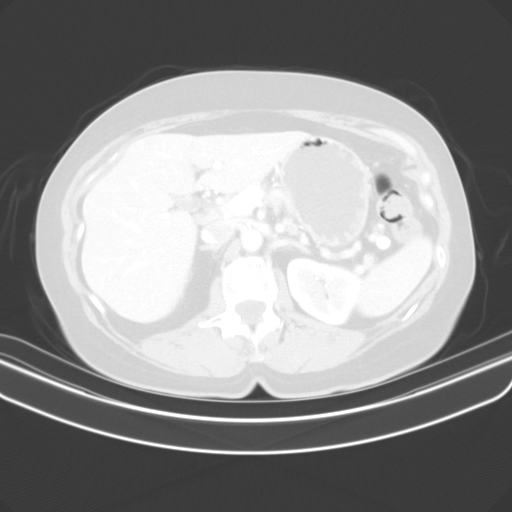
[im 70/88  soft-tissue]
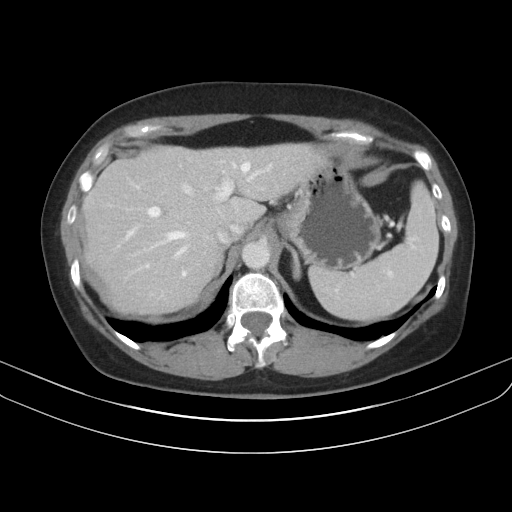
[im 70/88  lung]
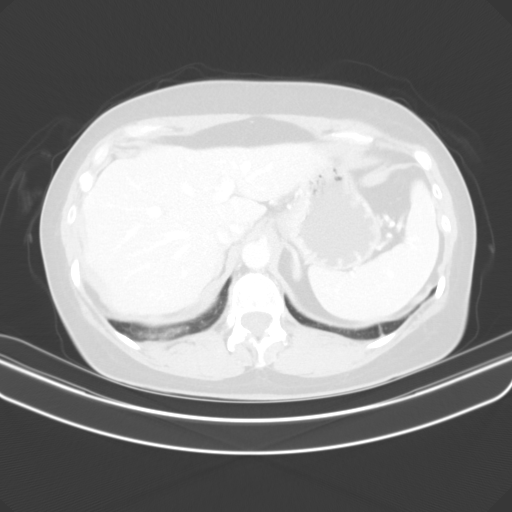
[im 76/88  soft-tissue]
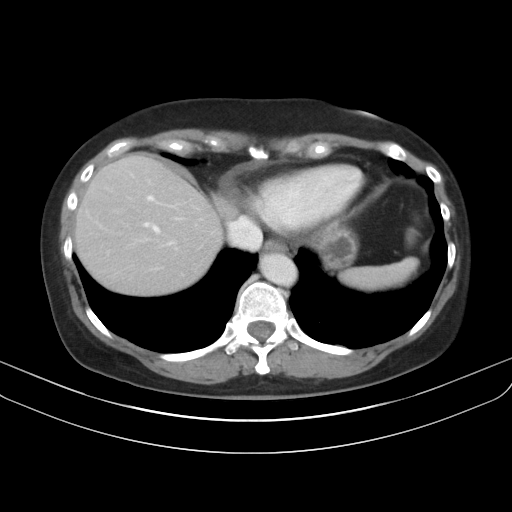
[im 76/88  lung]
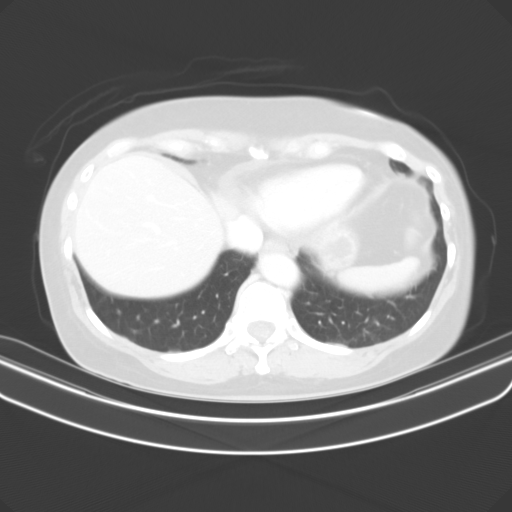
[im 82/88  soft-tissue]
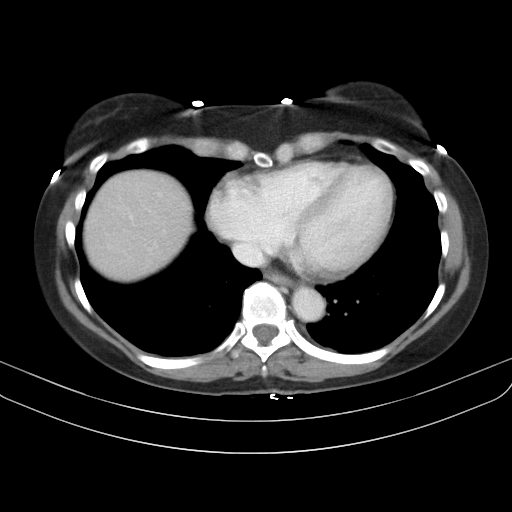
[im 82/88  lung]
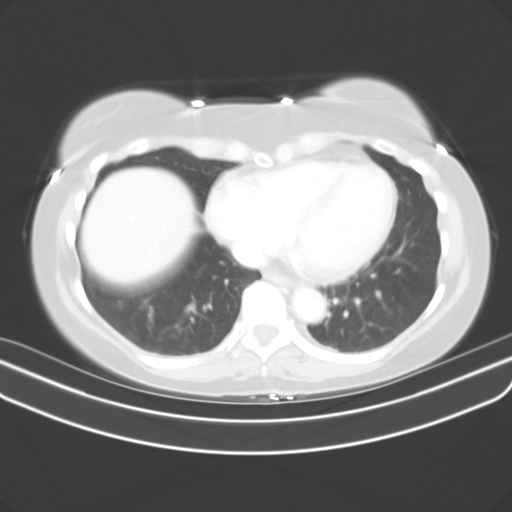

[13 of 32 positions shown; findings below may reference images not displayed]

FINDINGS: Lower chest: Unremarkable.

Hepatobiliary: Subcentimeter low-attenuation lesions in the left
lobe of the liver, too small to characterize, but statistically
likely to represent tiny cysts. No larger more suspicious appearing
hepatic lesions. No intra or extrahepatic biliary ductal dilatation.
Gallbladder is normal in appearance.

Pancreas: No pancreatic mass. No pancreatic ductal dilatation. No
pancreatic or peripancreatic fluid collections or inflammatory
changes.

Spleen: Unremarkable.

Adrenals/Urinary Tract: Subcentimeter low-attenuation lesion in the
upper pole the left kidney, too small to characterize, but
statistically likely to represent a cyst. Right kidney and bilateral
adrenal glands are normal in appearance. No hydroureteronephrosis.
Urinary bladder is normal in appearance.

Stomach/Bowel: Normal appearance of the stomach. No pathologic
dilatation of small bowel or colon. Numerous colonic diverticulae
are noted, without surrounding inflammatory changes to suggest an
acute diverticulitis at this time. Normal appendix.

Vascular/Lymphatic: Aortic atherosclerosis, without evidence of
aneurysm or dissection in the abdominal or pelvic vasculature. No
lymphadenopathy noted in the abdomen or pelvis.

Reproductive: Uterus and ovaries are unremarkable in appearance.

Other: No significant volume of ascites.  No pneumoperitoneum.

Musculoskeletal: There are no aggressive appearing lytic or blastic
lesions noted in the visualized portions of the skeleton.
IMPRESSION: 1. Colonic diverticulosis without findings to suggest an acute
diverticulitis at this time.
2. Normal appendix.
3. Aortic atherosclerosis.
4. Additional incidental findings, as above.

## 2019-09-14 DIAGNOSIS — G4733 Obstructive sleep apnea (adult) (pediatric): Secondary | ICD-10-CM | POA: Diagnosis not present

## 2019-11-04 ENCOUNTER — Other Ambulatory Visit: Payer: Self-pay | Admitting: Family Medicine

## 2019-11-04 DIAGNOSIS — Z1231 Encounter for screening mammogram for malignant neoplasm of breast: Secondary | ICD-10-CM

## 2019-12-10 DIAGNOSIS — G453 Amaurosis fugax: Secondary | ICD-10-CM | POA: Diagnosis not present

## 2019-12-10 DIAGNOSIS — H40013 Open angle with borderline findings, low risk, bilateral: Secondary | ICD-10-CM | POA: Diagnosis not present

## 2019-12-10 DIAGNOSIS — H2513 Age-related nuclear cataract, bilateral: Secondary | ICD-10-CM | POA: Diagnosis not present

## 2019-12-10 DIAGNOSIS — H04123 Dry eye syndrome of bilateral lacrimal glands: Secondary | ICD-10-CM | POA: Diagnosis not present

## 2019-12-13 ENCOUNTER — Other Ambulatory Visit: Payer: Self-pay | Admitting: Family Medicine

## 2019-12-13 DIAGNOSIS — G453 Amaurosis fugax: Secondary | ICD-10-CM

## 2019-12-15 ENCOUNTER — Other Ambulatory Visit (HOSPITAL_COMMUNITY): Payer: Self-pay | Admitting: Family Medicine

## 2019-12-15 DIAGNOSIS — G453 Amaurosis fugax: Secondary | ICD-10-CM

## 2019-12-16 ENCOUNTER — Encounter (HOSPITAL_COMMUNITY): Payer: Self-pay | Admitting: Radiology

## 2019-12-20 ENCOUNTER — Other Ambulatory Visit: Payer: BC Managed Care – PPO

## 2019-12-27 ENCOUNTER — Ambulatory Visit: Payer: BC Managed Care – PPO

## 2019-12-28 ENCOUNTER — Other Ambulatory Visit: Payer: BC Managed Care – PPO

## 2019-12-29 ENCOUNTER — Other Ambulatory Visit (HOSPITAL_COMMUNITY): Payer: BC Managed Care – PPO

## 2020-01-11 ENCOUNTER — Other Ambulatory Visit: Payer: BC Managed Care – PPO

## 2020-02-04 ENCOUNTER — Other Ambulatory Visit: Payer: Self-pay

## 2020-02-04 ENCOUNTER — Ambulatory Visit
Admission: RE | Admit: 2020-02-04 | Discharge: 2020-02-04 | Disposition: A | Discharge status: Home / Self Care | Payer: 59 | Source: Ambulatory Visit | Attending: Family Medicine | Admitting: Family Medicine

## 2020-02-04 DIAGNOSIS — Z1231 Encounter for screening mammogram for malignant neoplasm of breast: Secondary | ICD-10-CM

## 2020-02-08 ENCOUNTER — Other Ambulatory Visit: Payer: Self-pay

## 2020-02-08 ENCOUNTER — Ambulatory Visit (HOSPITAL_COMMUNITY): Payer: 59 | Attending: Cardiovascular Disease

## 2020-02-08 DIAGNOSIS — G453 Amaurosis fugax: Secondary | ICD-10-CM | POA: Diagnosis present

## 2020-03-23 ENCOUNTER — Encounter: Payer: Self-pay | Admitting: Neurology

## 2020-03-27 ENCOUNTER — Ambulatory Visit
Admission: RE | Admit: 2020-03-27 | Discharge: 2020-03-27 | Disposition: A | Discharge status: Home / Self Care | Payer: 59 | Source: Ambulatory Visit | Attending: Family Medicine | Admitting: Family Medicine

## 2020-03-27 DIAGNOSIS — G453 Amaurosis fugax: Secondary | ICD-10-CM

## 2020-04-10 IMAGING — US US CAROTID DUPLEX BILAT
1 series · 13 of 24 positions shown · non-contrast
Comparison: None.

CLINICAL DATA: 64-year-old female with a history of amaurosis fugax
left eye

EXAM:
BILATERAL CAROTID DUPLEX ULTRASOUND
TECHNIQUE: Gray scale imaging, color Doppler and duplex ultrasound were
performed of bilateral carotid and vertebral arteries in the neck.

[Series 1: us carotid duplex bilat · 0.05mm/px · 13 of 63 slices shown]
[im 1/63]
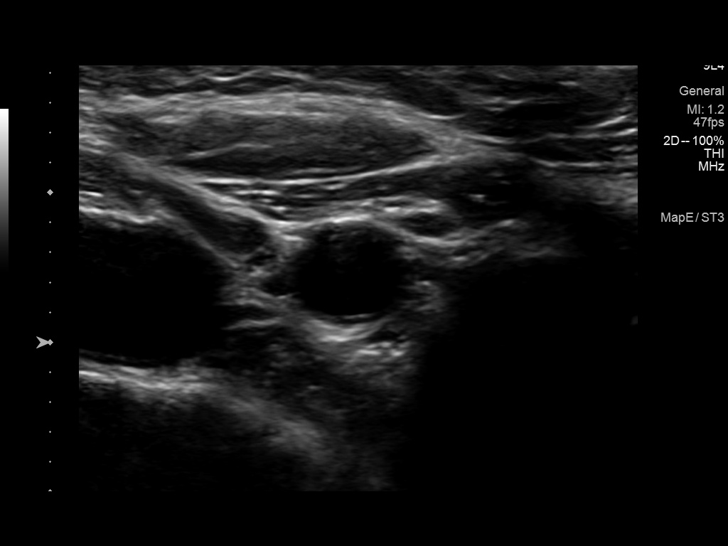
[im 6/63]
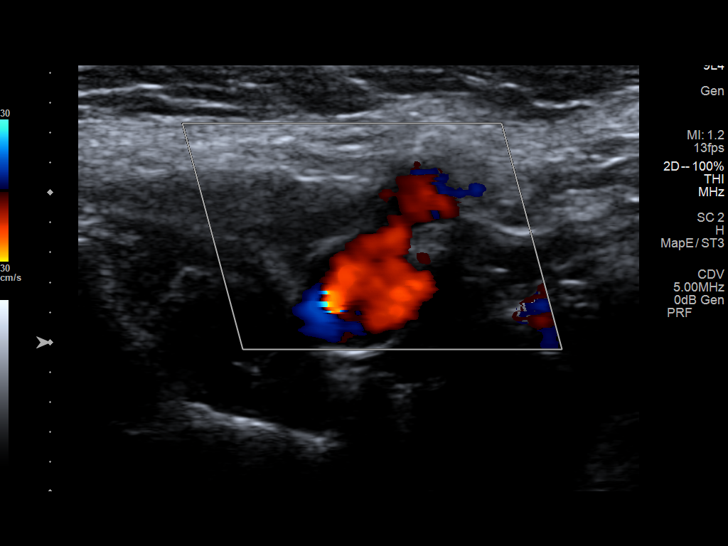
[im 11/63]
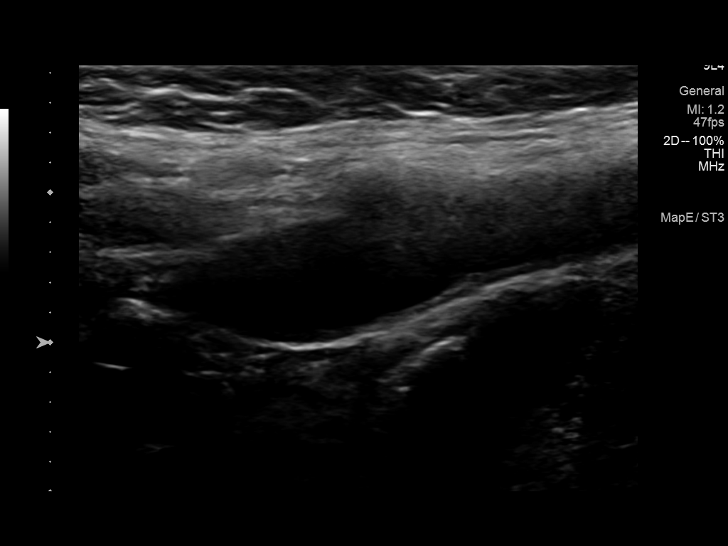
[im 17/63]
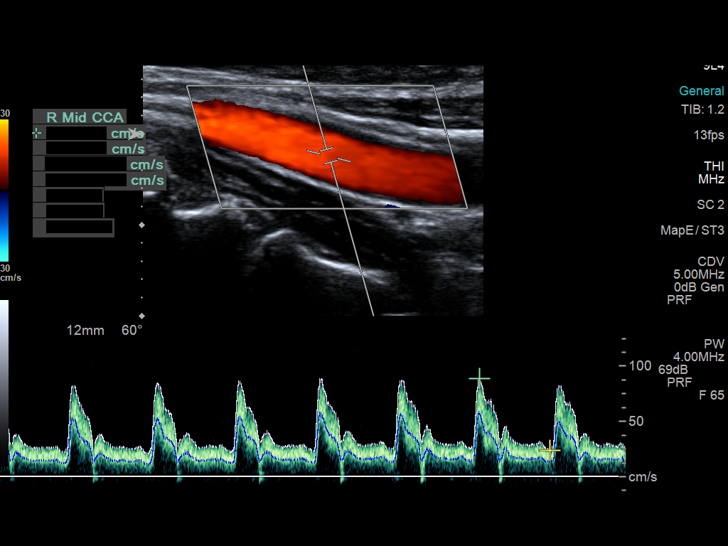
[im 22/63]
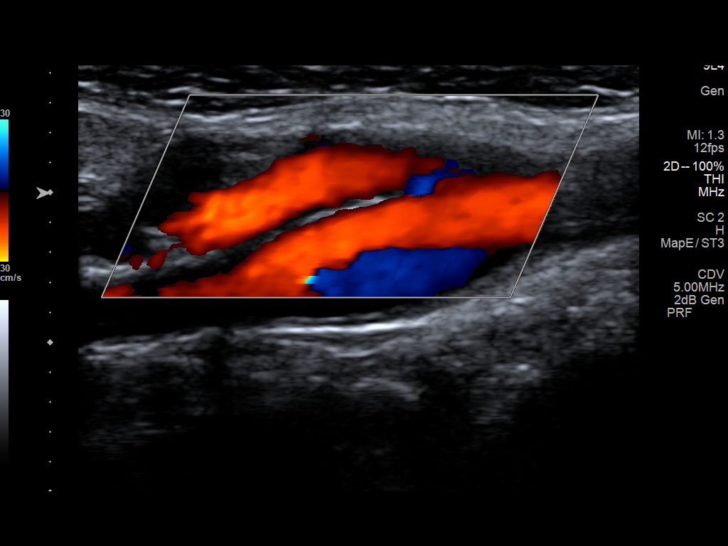
[im 27/63]
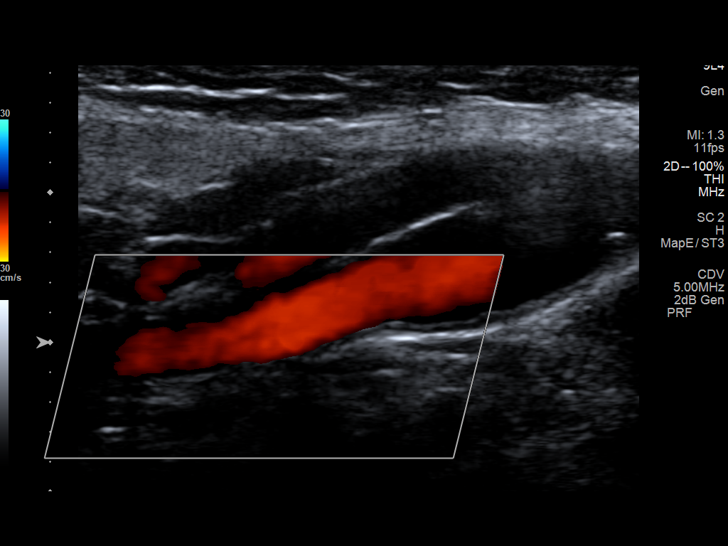
[im 33/63]
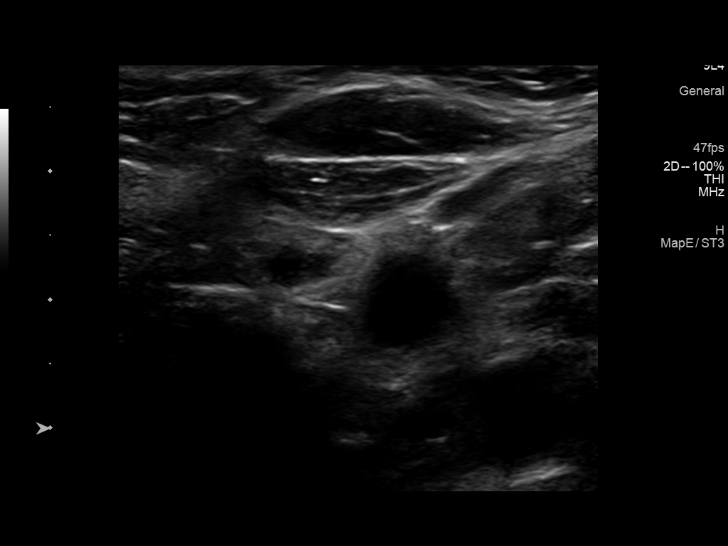
[im 36/63]
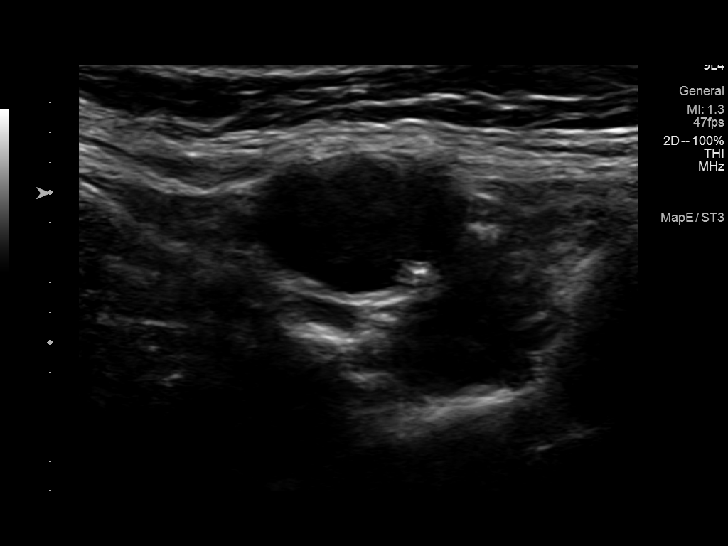
[im 41/63]
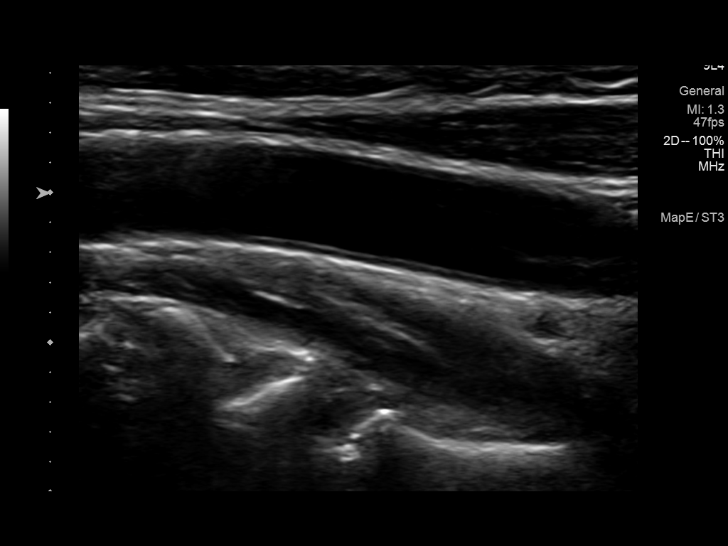
[im 46/63]
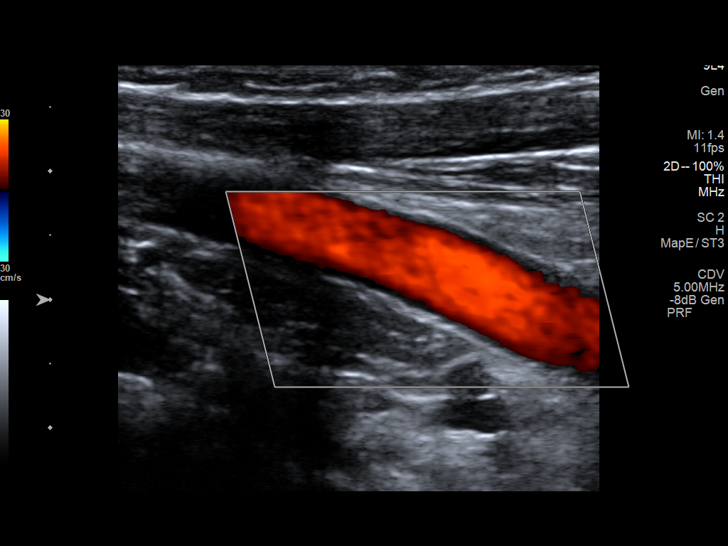
[im 52/63]
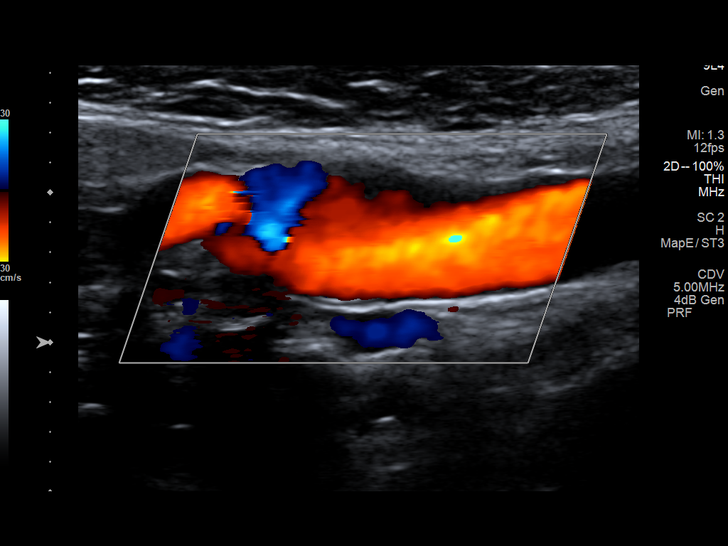
[im 57/63]
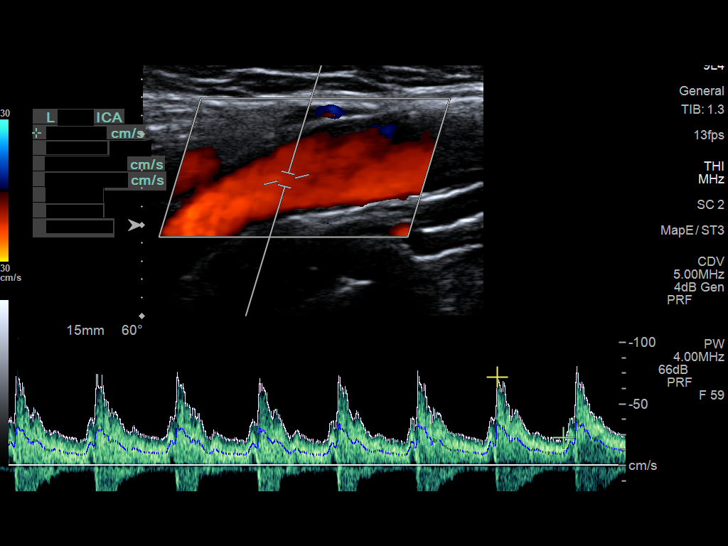
[im 63/63]
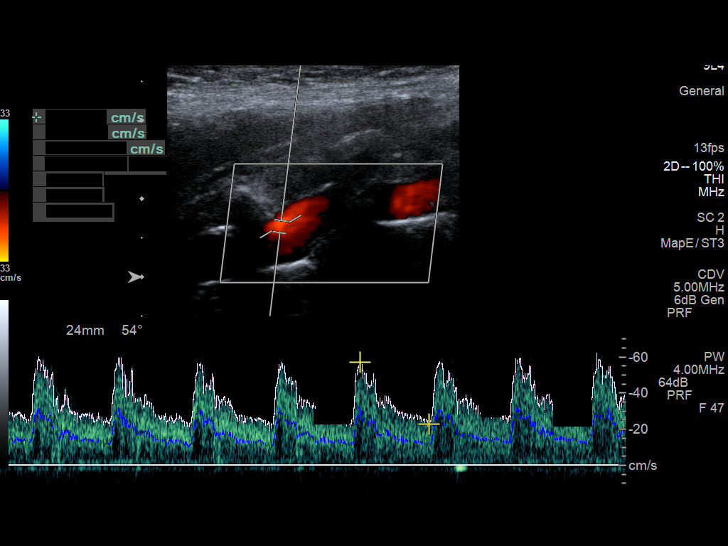

[13 of 24 positions shown; findings below may reference images not displayed]

FINDINGS: Criteria: Quantification of carotid stenosis is based on velocity
parameters that correlate the residual internal carotid diameter
with NASCET-based stenosis levels, using the diameter of the distal
internal carotid lumen as the denominator for stenosis measurement.

The following velocity measurements were obtained:

RIGHT

ICA:  Systolic 91 cm/sec, Diastolic 25 cm/sec

CCA:  91 cm/sec

SYSTOLIC ICA/CCA RATIO:

ECA:  86 cm/sec

LEFT

ICA:  Systolic 81 cm/sec, Diastolic 34 cm/sec

CCA:  106 cm/sec

SYSTOLIC ICA/CCA RATIO:

ECA:  90 cm/sec

Right Brachial SBP: 143

Left Brachial SBP: 129

RIGHT CAROTID ARTERY: No significant calcified disease of the right
common carotid artery. Intermediate waveform maintained.
Heterogeneous plaque without significant calcifications at the right
carotid bifurcation. Low resistance waveform of the right ICA. No
significant tortuosity.

RIGHT VERTEBRAL ARTERY: Antegrade flow with low resistance waveform.

LEFT CAROTID ARTERY: No significant calcified disease of the left
common carotid artery. Intermediate waveform maintained.
Heterogeneous plaque at the left carotid bifurcation without
significant calcifications. Low resistance waveform of the left ICA.

LEFT VERTEBRAL ARTERY:  Antegrade flow with low resistance waveform.
IMPRESSION: Color duplex indicates minimal heterogeneous plaque, with no
hemodynamically significant stenosis by duplex criteria in the
extracranial cerebrovascular circulation.

## 2020-04-13 ENCOUNTER — Ambulatory Visit: Payer: 59 | Admitting: Neurology

## 2020-05-11 ENCOUNTER — Ambulatory Visit: Payer: 59 | Admitting: Neurology

## 2020-12-29 ENCOUNTER — Encounter (INDEPENDENT_AMBULATORY_CARE_PROVIDER_SITE_OTHER): Payer: Self-pay
